# Patient Record
Sex: Male | Born: 1985 | Race: White | Hispanic: No | Marital: Single | Smoking: Never smoker
Health system: Southern US, Community
[De-identification: ages and names within clinical notes are randomized; demographics above are authoritative.]

---
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
## 2018-10-24 ENCOUNTER — Encounter (HOSPITAL_COMMUNITY): Payer: Self-pay

## 2018-10-24 ENCOUNTER — Emergency Department (HOSPITAL_COMMUNITY)
Admission: EM | Admit: 2018-10-24 | Discharge: 2018-10-24 | Disposition: A | Discharge status: Home / Self Care | Payer: BLUE CROSS/BLUE SHIELD | Attending: Emergency Medicine | Admitting: Emergency Medicine

## 2018-10-24 ENCOUNTER — Other Ambulatory Visit: Payer: Self-pay

## 2018-10-24 DIAGNOSIS — K600 Acute anal fissure: Secondary | ICD-10-CM | POA: Insufficient documentation

## 2018-10-24 DIAGNOSIS — K602 Anal fissure, unspecified: Secondary | ICD-10-CM

## 2018-10-24 DIAGNOSIS — K625 Hemorrhage of anus and rectum: Secondary | ICD-10-CM | POA: Diagnosis present

## 2018-10-24 LAB — CBC WITH DIFFERENTIAL/PLATELET
Abs Immature Granulocytes: 0.03 10*3/uL (ref 0.00–0.07)
Basophils Absolute: 0 10*3/uL (ref 0.0–0.1)
Basophils Relative: 0 %
Eosinophils Absolute: 0.2 10*3/uL (ref 0.0–0.5)
Eosinophils Relative: 3 %
HCT: 43.1 % (ref 39.0–52.0)
Hemoglobin: 14.6 g/dL (ref 13.0–17.0)
Immature Granulocytes: 0 %
Lymphocytes Relative: 28 %
Lymphs Abs: 2.6 10*3/uL (ref 0.7–4.0)
MCH: 29.6 pg (ref 26.0–34.0)
MCHC: 33.9 g/dL (ref 30.0–36.0)
MCV: 87.2 fL (ref 80.0–100.0)
Monocytes Absolute: 0.5 10*3/uL (ref 0.1–1.0)
Monocytes Relative: 5 %
Neutro Abs: 6 10*3/uL (ref 1.7–7.7)
Neutrophils Relative %: 64 %
Platelets: 275 10*3/uL (ref 150–400)
RBC: 4.94 MIL/uL (ref 4.22–5.81)
RDW: 12.6 % (ref 11.5–15.5)
WBC: 9.3 10*3/uL (ref 4.0–10.5)
nRBC: 0 % (ref 0.0–0.2)

## 2018-10-24 LAB — BASIC METABOLIC PANEL
Anion gap: 9 (ref 5–15)
BUN: 8 mg/dL (ref 6–20)
CO2: 24 mmol/L (ref 22–32)
Calcium: 9.5 mg/dL (ref 8.9–10.3)
Chloride: 107 mmol/L (ref 98–111)
Creatinine, Ser: 0.84 mg/dL (ref 0.61–1.24)
GFR calc Af Amer: >60 mL/min (ref >60)
GFR calc non Af Amer: >60 mL/min (ref >60)
Glucose, Bld: 93 mg/dL (ref 70–99)
Potassium: 3.7 mmol/L (ref 3.5–5.1)
Sodium: 140 mmol/L (ref 135–145)

## 2018-10-24 LAB — POC OCCULT BLOOD, ED: Fecal Occult Bld: POSITIVE — AB

## 2018-10-24 MED ORDER — NITROGLYCERIN 0.4 % RE OINT: 1.0000 [in_us] | TOPICAL_OINTMENT | Freq: Two times a day (BID) | RECTAL | 0 refills | Status: AC

## 2018-10-24 MED ORDER — LIDOCAINE 2 % EX GEL: 1.0000 | Freq: Three times a day (TID) | CUTANEOUS | 0 refills | Status: AC | PRN

## 2018-10-24 NOTE — ED Notes (Signed)
ED Provider at bedside. 

## 2018-10-24 NOTE — ED Triage Notes (Signed)
Pt reports rectal bleeding that started two days, at first just spotting blood when wiping and mixed with stool, tonight after BM pt noticed large amount of bright red blood in toilet. Pt reports stomach pain afterwards that still persists- currently rates a 3 of 10. Pt says he checked for blood prior coming and was not actively bleeding.

## 2018-10-24 NOTE — Discharge Instructions (Addendum)
Follow-up with your primary doctor or gastroenterologist.  The nitroglycerin ointment may cause low blood pressure and dizziness as well as headaches.  You should apply it when you are sitting down and stand up slowly afterwards.  Wash your hands thoroughly after using it.  Return to the ED with worsening pain, bleeding, dizziness or any other concerns.

## 2018-10-24 NOTE — ED Provider Notes (Signed)
[facility] EMERGENCY DEPARTMENT Provider Note   CSN: [id number] Arrival date & time: 10/24/18  0100    History   Chief Complaint Chief Complaint  Patient presents with  . Rectal Bleeding    HPI Scott Whitaker is a 33 y.o. male.     Patient reports episode of rectal bleeding while working at [employer] tonight.  States he went to have a bowel movement which he thought was normal but when he went to get up he noticed that the toilet bowl was full of blood and there was blood when he wiped.  He reports the stool itself was brown and not red or black.  It was not painful to have a bowel movement and he was not straining.  Since then he has been having some upper abdominal epigastric pain which is starting to improve.  Denies any nausea, vomiting or fever.  States over the past few days has had intermittent blood when he wipes that is been nonpainful.  Normally moves his bowels every day and denies straining or constipation.  Reports he has had no black or red or bloody stools.  Denies any other medical problems.  Denies any dizziness or lightheadedness.  Does not take any blood thinners.  No family history of Crohn's disease or ulcerative colitis. No history of GERD or ulcers.  The history is provided by the patient.  Rectal Bleeding  Associated symptoms: abdominal pain   Associated symptoms: no dizziness and no vomiting     History reviewed. No pertinent past medical history.  There are no active problems to display for this patient.   History reviewed. No pertinent surgical history.      Home Medications    Prior to Admission medications   Not on File    Family History No family history on file.  Social History Social History   Tobacco Use  . Smoking status: Never Smoker  . Smokeless tobacco: Never Used  Substance Use Topics  . Alcohol use: Never    Frequency: Never  . Drug use: Never     Allergies   Patient has no known allergies.   Review of Systems  Review of Systems  Constitutional: Negative for activity change and appetite change.  HENT: Negative for congestion and rhinorrhea.   Respiratory: Negative for cough, chest tightness and shortness of breath.   Gastrointestinal: Positive for abdominal pain, anal bleeding, blood in stool and hematochezia. Negative for nausea and vomiting.  Genitourinary: Negative for dysuria and hematuria.  Musculoskeletal: Negative for arthralgias and myalgias.  Skin: Negative for rash.  Neurological: Negative for dizziness, weakness and headaches.    all other systems are negative except as noted in the HPI and PMH.    Physical Exam Updated Vital Signs BP (!) 127/100 (BP Location: Left Arm)   Pulse 79   Temp 98.1 F (36.7 C) (Oral)   Resp 16   Ht 5' 9.5" (1.765 m)   Wt 88.5 kg   SpO2 97%   BMI 28.38 kg/m   Physical Exam Vitals signs and nursing note reviewed.  Constitutional:      General: He is not in acute distress.    Appearance: He is well-developed.  HENT:     Head: Normocephalic and atraumatic.     Mouth/Throat:     Pharynx: No oropharyngeal exudate.  Eyes:     Conjunctiva/sclera: Conjunctivae normal.     Pupils: Pupils are equal, round, and reactive to light.  Neck:     Musculoskeletal: Normal  range of motion and neck supple.     Comments: No meningismus. Cardiovascular:     Rate and Rhythm: Normal rate and regular rhythm.     Heart sounds: Normal heart sounds. No murmur.  Pulmonary:     Effort: Pulmonary effort is normal. No respiratory distress.     Breath sounds: Normal breath sounds.  Abdominal:     Palpations: Abdomen is soft.     Tenderness: There is abdominal tenderness. There is no guarding or rebound.     Comments: Mild epigastric tenderness, no guarding or rebound  Genitourinary:    Comments: No external hemorrhoids visualized.  There is a anal fissure at the 12 o'clock position with evidence of recent bleeding. No gross blood on digital exam. Musculoskeletal:  Normal range of motion.        General: No tenderness.  Skin:    General: Skin is warm.     Capillary Refill: Capillary refill takes less than 2 seconds.  Neurological:     General: No focal deficit present.     Mental Status: He is alert and oriented to person, place, and time. Mental status is at baseline.     Cranial Nerves: No cranial nerve deficit.     Motor: No abnormal muscle tone.     Coordination: Coordination normal.     Comments: No ataxia on finger to nose bilaterally. No pronator drift. 5/5 strength throughout. CN 2-12 intact.Equal grip strength. Sensation intact.   Psychiatric:        Behavior: Behavior normal.      ED Treatments / Results  Labs (all labs ordered are listed, but only abnormal results are displayed) Labs Reviewed  POC OCCULT BLOOD, ED - Abnormal; Notable for the following components:      Result Value   Fecal Occult Bld POSITIVE (*)    All other components within normal limits  CBC WITH DIFFERENTIAL/PLATELET  BASIC METABOLIC PANEL    EKG None  Radiology No results found.  Procedures Procedures (including critical care time)  Medications Ordered in ED Medications - No data to display   Initial Impression / Assessment and Plan / ED Course  I have reviewed the triage vital signs and the nursing notes.  Pertinent labs & imaging results that were available during my care of the patient were reviewed by me and considered in my medical decision making (see chart for details).       Rectal bleeding after a bowel movement, no pain.  He is hemodynamically stable.  Suspect his anal fissure is the source of this bleeding.  Patient denies any recent constipation or straining.  We will treat anal fissure supportively with sitz bath, topical pain control, and vasodilators.  Hemoglobin normal 14.6.  orthostatics negative.  Abdomen soft and nontender.  Advised follow-up with PCP and gastroenterology regarding anal fissure.  Advised increasing  fiber in the diet, topical nifedipine, topical lidocaine.  Advised patient that this may be a symptom of something more serious such as Crohn's disease but this seems less likely at this time.  Return precautions discussed including worsening pain, bleeding, dizziness or any other concerns.  Topical nifedipine not available. Will change to topical nitroglycerin.  Patient informed of possible side effects of headache and hypotension causing dizziness.  Advised to wear gloves when applying this medication and applying while in a sitting position. Final Clinical Impressions(s) / ED Diagnoses   Final diagnoses:  Anal fissure    ED Discharge Orders    None  Glynn Octave, MD 10/24/18 [phone]

## 2020-01-24 ENCOUNTER — Emergency Department (HOSPITAL_COMMUNITY)
Admission: EM | Admit: 2020-01-24 | Discharge: 2020-01-24 | Disposition: A | Discharge status: Home / Self Care | Payer: PRIVATE HEALTH INSURANCE | Attending: Emergency Medicine | Admitting: Emergency Medicine

## 2020-01-24 ENCOUNTER — Other Ambulatory Visit: Payer: Self-pay

## 2020-01-24 ENCOUNTER — Encounter (HOSPITAL_COMMUNITY): Payer: Self-pay

## 2020-01-24 DIAGNOSIS — Y9389 Activity, other specified: Secondary | ICD-10-CM | POA: Insufficient documentation

## 2020-01-24 DIAGNOSIS — S61213A Laceration without foreign body of left middle finger without damage to nail, initial encounter: Secondary | ICD-10-CM | POA: Diagnosis not present

## 2020-01-24 DIAGNOSIS — Y9289 Other specified places as the place of occurrence of the external cause: Secondary | ICD-10-CM | POA: Diagnosis not present

## 2020-01-24 DIAGNOSIS — Y998 Other external cause status: Secondary | ICD-10-CM | POA: Insufficient documentation

## 2020-01-24 DIAGNOSIS — W268XXA Contact with other sharp object(s), not elsewhere classified, initial encounter: Secondary | ICD-10-CM | POA: Diagnosis not present

## 2020-01-24 MED ORDER — POVIDONE-IODINE 10 % EX SOLN
CUTANEOUS | Status: AC
  Filled 2020-01-24: qty 15

## 2020-01-24 MED ORDER — LIDOCAINE HCL (PF) 2 % IJ SOLN
INTRAMUSCULAR | Status: AC
  Filled 2020-01-24: qty 10

## 2020-01-24 MED ORDER — POVIDONE-IODINE 10 % EX OINT
TOPICAL_OINTMENT | Freq: Once | CUTANEOUS | Status: DC
  Filled 2020-01-24: qty 28.35

## 2020-01-24 NOTE — ED Provider Notes (Signed)
[facility] EMERGENCY DEPARTMENT Provider Note   CSN: [id number] Arrival date & time: 01/24/20  0510     History Chief Complaint  Patient presents with  . Laceration    Scott Whitaker is a 34 y.o. male.   Laceration Location:  Finger Finger laceration location:  L middle finger Length:  1 Depth:  Cutaneous Quality: straight   Bleeding: controlled   Time since incident:  1 hour Laceration mechanism:  Metal edge Pain details:    Quality:  Sharp   Severity:  Mild   Timing:  Constant      History reviewed. No pertinent past medical history.  There are no problems to display for this patient.   History reviewed. No pertinent surgical history.     History reviewed. No pertinent family history.  Social History   Tobacco Use  . Smoking status: Never Smoker  . Smokeless tobacco: Never Used  Vaping Use  . Vaping Use: Never used  Substance Use Topics  . Alcohol use: Never  . Drug use: Never    Home Medications Prior to Admission medications   Medication Sig Start Date End Date Taking? Authorizing Provider  Lidocaine 2 % GEL Apply 1 Film topically 3 (three) times daily as needed. 10/24/18   Rancour, Jeannett Senior, MD  Nitroglycerin 0.4 % OINT Place 1 inch rectally 2 (two) times a day for 7 days. May cause headaches, wash hands thoroughly after use 10/24/18 10/31/18  Rancour, Jeannett Senior, MD    Allergies    Patient has no known allergies.  Review of Systems   Review of Systems  All other systems reviewed and are negative.   Physical Exam Updated Vital Signs BP (!) 131/91   Pulse 97   Temp 99 F (37.2 C) (Oral)   Resp 16   Ht 5\' 9"  (1.753 m)   Wt 88.5 kg   SpO2 99%   BMI 28.81 kg/m   Physical Exam Vitals and nursing note reviewed.  Constitutional:      Appearance: He is well-developed.  HENT:     Head: Normocephalic and atraumatic.     Mouth/Throat:     Mouth: Mucous membranes are moist.     Pharynx: Oropharynx is clear.  Eyes:      Conjunctiva/sclera: Conjunctivae normal.     Pupils: Pupils are equal, round, and reactive to light.  Cardiovascular:     Rate and Rhythm: Normal rate.  Pulmonary:     Effort: Pulmonary effort is normal. No respiratory distress.  Abdominal:     General: Abdomen is flat. There is no distension.  Musculoskeletal:        General: Normal range of motion.     Cervical back: Normal range of motion.  Skin:    General: Skin is warm and dry.     Coloration: Skin is not jaundiced or pale.     Comments: Laceration to left middle finger, relatively superficial  Neurological:     Mental Status: He is alert.     ED Results / Procedures / Treatments   Labs (all labs ordered are listed, but only abnormal results are displayed) Labs Reviewed - No data to display  EKG None  Radiology No results found.  Procedures . Laceration Repair  Date/Time: 01/24/2020 6:38 AM Performed by: 03/25/2020, MD Authorized by: Marily Memos, MD   Consent:    Consent obtained:  Verbal   Consent given by:  Patient   Risks discussed:  Infection, need for additional repair, nerve damage, poor wound  healing, poor cosmetic result, pain, retained foreign body, tendon damage and vascular damage   Alternatives discussed:  No treatment, delayed treatment and observation Anesthesia (see MAR for exact dosages):    Anesthesia method:  Local infiltration   Local anesthetic:  Lidocaine 1% w/o epi Laceration details:    Location:  Finger   Finger location:  L long finger   Length (cm):  1   Depth (mm):  2 Repair type:    Repair type:  Simple Pre-procedure details:    Preparation:  Patient was prepped and draped in usual sterile fashion and imaging obtained to evaluate for foreign bodies Exploration:    Wound exploration: wound explored through full range of motion and entire depth of wound probed and visualized   Treatment:    Area cleansed with:  Saline and Betadine   Amount of cleaning:  Extensive    Irrigation solution:  Sterile water Skin repair:    Repair method:  Tissue adhesive Approximation:    Approximation:  Close Post-procedure details:    Dressing:  Non-adherent dressing and splint for protection   Patient tolerance of procedure:  Tolerated well, no immediate complications   (including critical care time)  Medications Ordered in ED Medications  povidone-iodine (BETADINE) 10 % ointment (has no administration in time range)    ED Course  I have reviewed the triage vital signs and the nursing notes.  Pertinent labs & imaging results that were available during my care of the patient were reviewed by me and considered in my medical decision making (see chart for details).    MDM Rules/Calculators/A&P                          Wound repaired as above. Splinted for protection. Otherwise can take care of wound normally after a couple days.   Final Clinical Impression(s) / ED Diagnoses Final diagnoses:  Laceration of left middle finger without foreign body, nail damage status unspecified, initial encounter    Rx / DC Orders ED Discharge Orders    None       Erisa Mehlman, Barbara Cower, MD 01/24/20 [phone]

## 2020-01-24 NOTE — ED Triage Notes (Signed)
Pt was at work [employer]) when he was cleaning up and cut his left middle finger on glass.  Laceration on finger. Bleeding controlled. States his Tetanus is UTD

## 2022-05-28 DIAGNOSIS — J101 Influenza due to other identified influenza virus with other respiratory manifestations: Secondary | ICD-10-CM | POA: Diagnosis not present

## 2022-05-28 DIAGNOSIS — Z20822 Contact with and (suspected) exposure to covid-19: Secondary | ICD-10-CM | POA: Diagnosis not present

## 2022-05-28 DIAGNOSIS — R519 Headache, unspecified: Secondary | ICD-10-CM | POA: Diagnosis not present

## 2023-04-04 DIAGNOSIS — R0989 Other specified symptoms and signs involving the circulatory and respiratory systems: Secondary | ICD-10-CM | POA: Diagnosis not present
